# Patient Record
Sex: Male | Born: 1995 | Hispanic: No | Marital: Single | Smoking: Never smoker
Health system: Southern US, Community
[De-identification: ages and names within clinical notes are randomized; demographics above are authoritative.]

## PROBLEM LIST (undated history)

## (undated) DIAGNOSIS — A770 Spotted fever due to Rickettsia rickettsii: Secondary | ICD-10-CM

---
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
## 2014-05-29 IMAGING — CR DG CHEST 2V
2 series · 2 of 2 positions shown · non-contrast
Comparison: None.

CLINICAL DATA: Fever, cough, headache, and nausea and vomiting.

CHEST - 2 VIEW

[w chest pa]
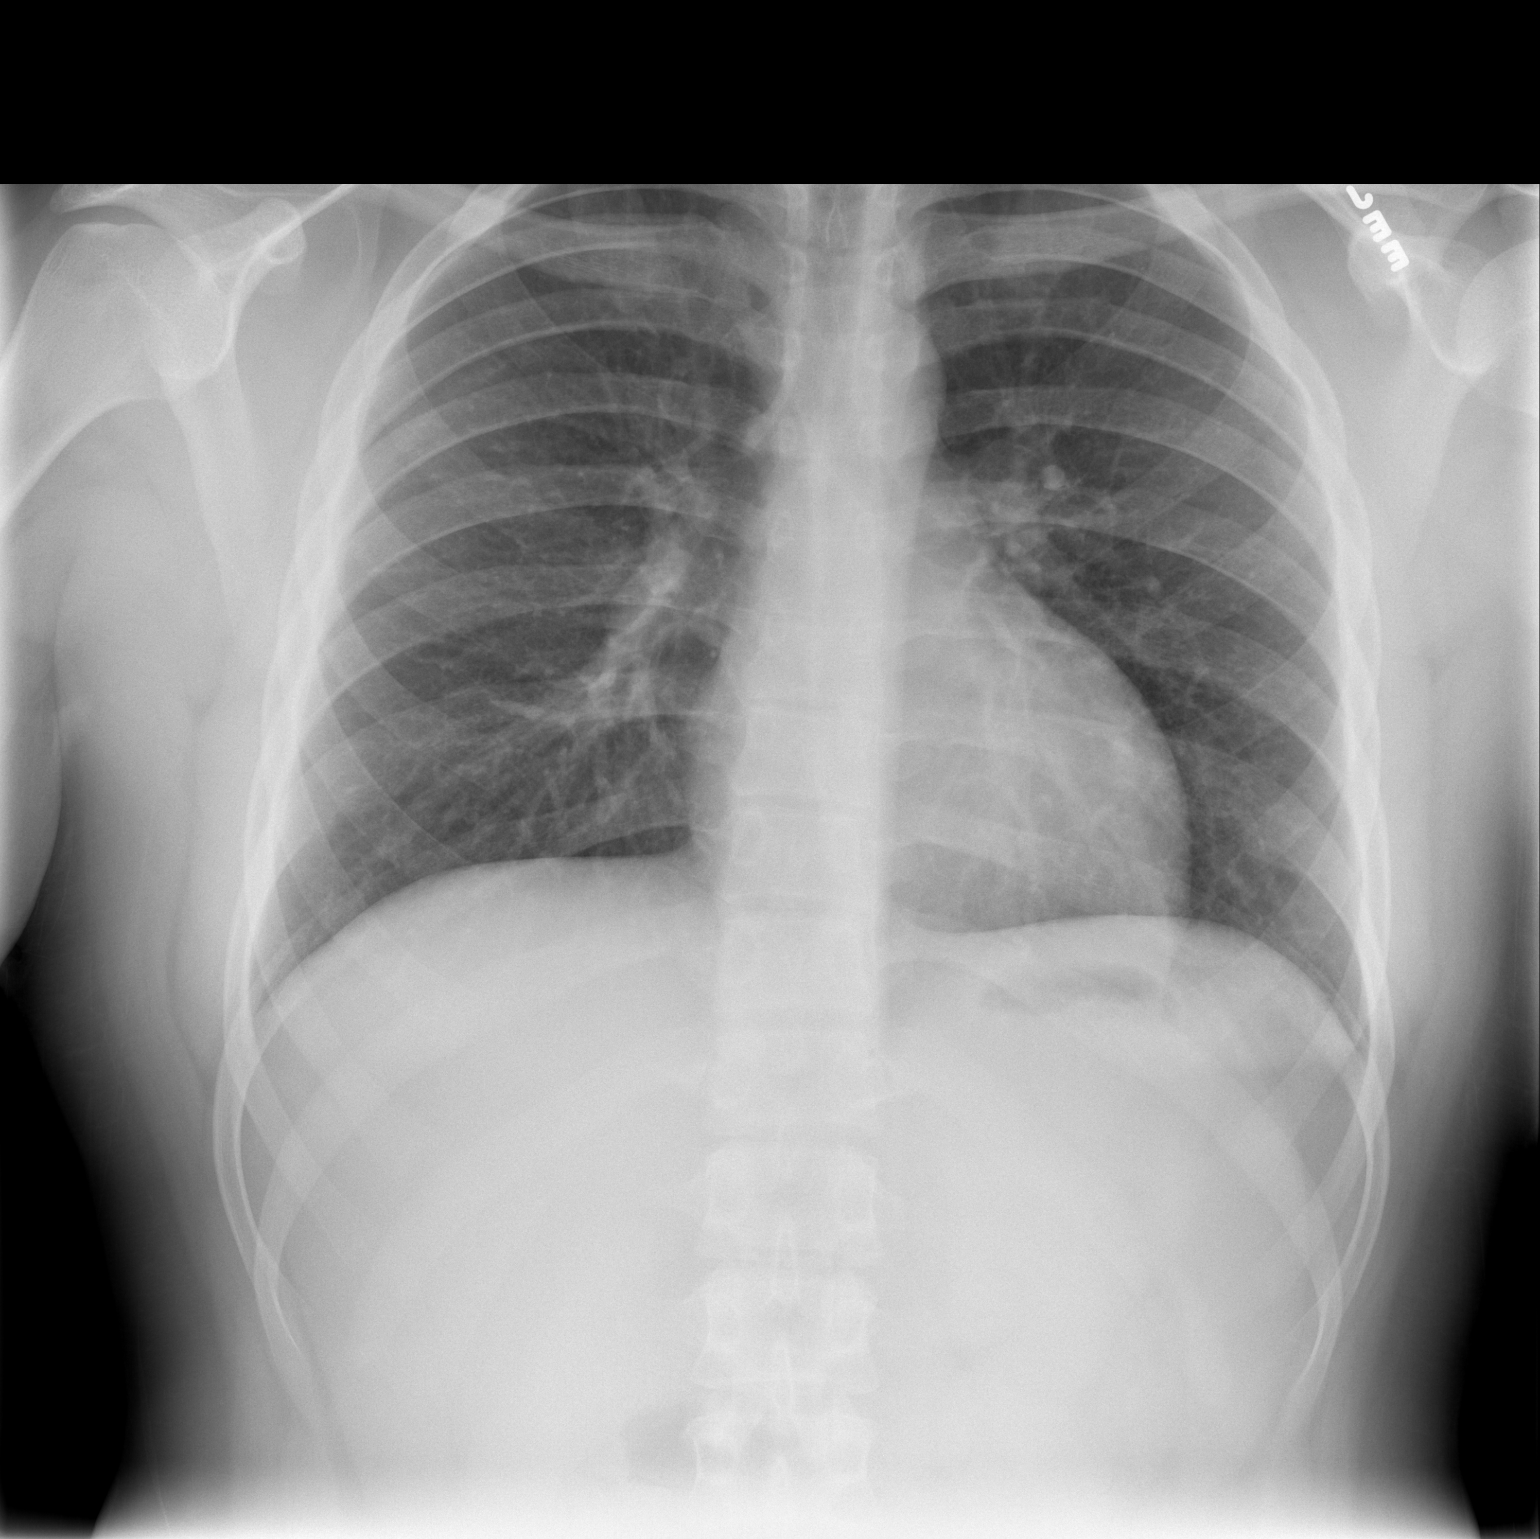

[w chest lat *]
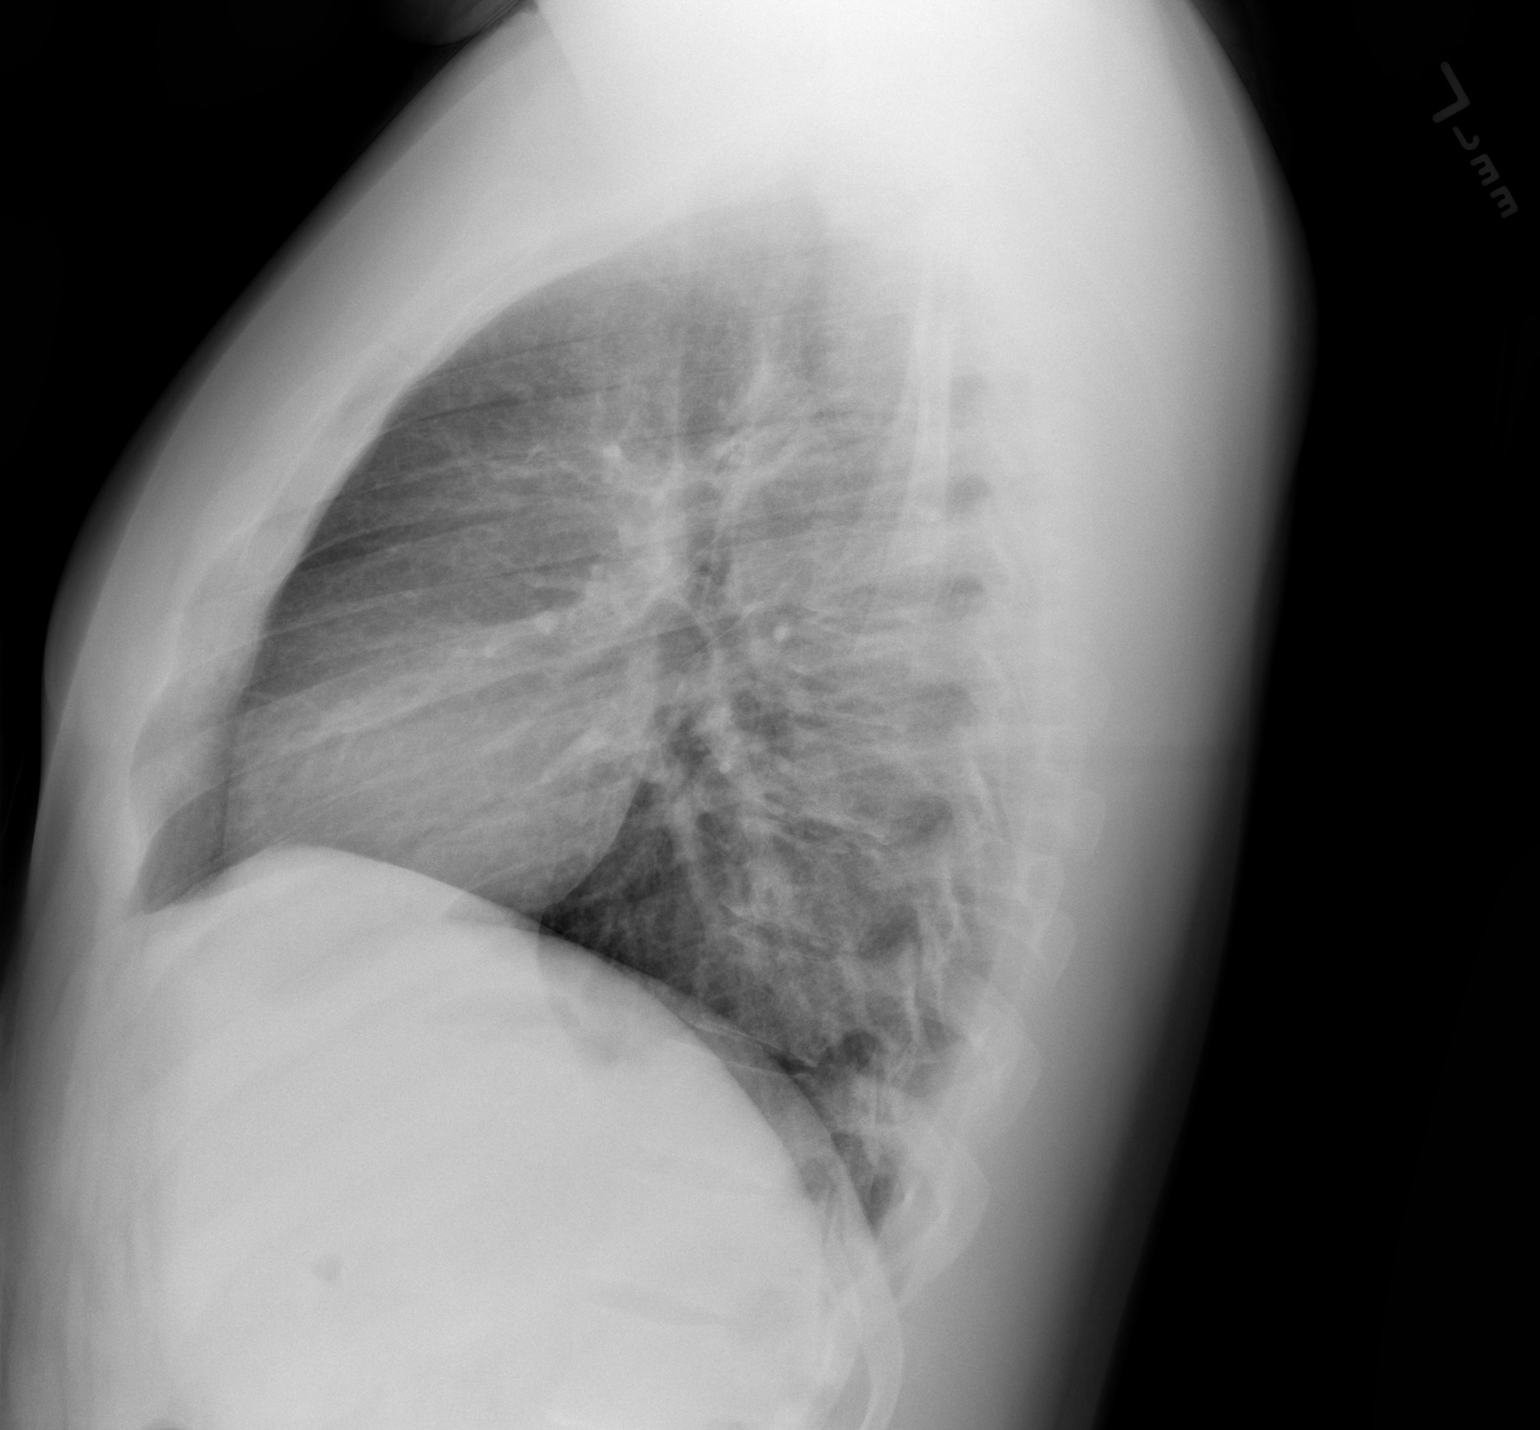

[2 of 2 positions shown; findings below may reference images not displayed]

FINDINGS: Heart size and pulmonary vascularity are normal and the
lungs are clear.  No osseous abnormality.
IMPRESSION: Normal exam.

## 2019-09-06 ENCOUNTER — Ambulatory Visit
Admission: EM | Admit: 2019-09-06 | Discharge: 2019-09-06 | Disposition: A | Discharge status: Home / Self Care | Payer: Self-pay

## 2019-09-06 DIAGNOSIS — L859 Epidermal thickening, unspecified: Secondary | ICD-10-CM

## 2019-09-06 NOTE — Discharge Instructions (Addendum)
Keep area(s) clean and dry. Return for worsening pain, redness, swelling, discharge, fever.  Helpful prevention tips: Keep nails short to avoid secondary skin infections. Use new, clean razors when shaving. Avoid antiperspirants - look for deodorants without aluminum. Avoid wearing underwire bras as this can irritate the area further.

## 2019-09-06 NOTE — ED Provider Notes (Signed)
[facility]    CSN: [id number] Arrival date & time: 09/06/19  1706      History   Chief Complaint Chief Complaint  Patient presents with  . Wound Check    HPI George Charles is a 24 y.o. male presenting for left lower extremity wound check.  States that he slipped on a ladder, hit his left shin around late November.  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  Denies significant pain, blackening, distal extremity weakness or numbness, fever, arthralgias, myalgias.  Denies history of diabetes, immunocompromised state.   Past Medical History:  Diagnosis Date  . [facility] spotted fever     There are no problems to display for this patient.   History reviewed. No pertinent surgical history.     Home Medications    Prior to Admission medications   Not on File    Family History History reviewed. No pertinent family history.  Social History Social History   Tobacco Use  . Smoking status: Never Smoker  . Smokeless tobacco: Never Used  Substance Use Topics  . Alcohol use: No  . Drug use: No     Allergies   Patient has no known allergies.   Review of Systems As per HPI   Physical Exam Triage Vital Signs ED Triage Vitals  Enc Vitals Group     BP 09/06/19 1751 (!) 152/93     Pulse Rate 09/06/19 1751 60     Resp 09/06/19 1751 14     Temp 09/06/19 1751 98 F (36.7 C)     Temp Source 09/06/19 1751 Oral     SpO2 09/06/19 1751 95 %     Weight --      Height --      Head Circumference --      Peak Flow --      Pain Score 09/06/19 1759 0     Pain Loc --      Pain Edu? --      Excl. in [place]? --    No data found.  Updated Vital Signs BP (!) 152/93 (BP Location: Left Arm)   Pulse 60   Temp 98 F (36.7 C) (Oral)   Resp 14   SpO2 95%   Visual Acuity Right Eye Distance:   Left Eye Distance:   Bilateral  Distance:    Right Eye Near:   Left Eye Near:    Bilateral Near:     Physical Exam Constitutional:      General: He is not in acute distress. HENT:     Head: Normocephalic and atraumatic.  Eyes:     General: No scleral icterus.    Pupils: Pupils are equal, round, and reactive to light.  Cardiovascular:     Rate and Rhythm: Normal rate.  Pulmonary:     Effort: Pulmonary effort is normal. No respiratory distress.     Breath sounds: No wheezing.  Musculoskeletal:        General: Normal range of motion.     Right lower leg: No edema.     Left lower leg: No edema.  Skin:    Coloration: Skin is not jaundiced or pale.     Comments: 1 cm circumferential scab with hyperkeratosis.  Minimal surrounding hyperpigmentation that is without warmth or tenderness.  No streaking or surrounding fluctuance/induration.  Neurological:  Mental Status: He is alert and oriented to person, place, and time.      UC Treatments / Results  Labs (all labs ordered are listed, but only abnormal results are displayed) Labs Reviewed - No data to display  EKG   Radiology No results found.  Procedure  PROCEDURE NOTE:  Procedure: Skin lesion removal Location: left lower leg, anterior Size: 1 cm Risk/benefits of procedure were discussed and all questions answered.  Consent obtained verbally by patient prior to procedure. Anesthesia administered locally with 2 cc of 2% lidocaine. Anesthesia was achieved.    Procedure details:  Area prepped and draped in usual sterile fashion.  A 1 cm round skin lesion was grasped with kelly hemostats and excised using a # 15 blade.  Good granulation tissue without bleeding or purulent discharge was present.  Patient tolerated procedure well.  Estimated blood loss 0 cc.  Medications Ordered in UC Medications - No data to display  Initial Impression / Assessment and Plan / UC Course  I have reviewed the triage vital signs and the nursing notes.  Pertinent labs  & imaging results that were available during my care of the patient were reviewed by me and considered in my medical decision making (see chart for details).     H&P consistent with hyperkeratosis of skin over previous injury.  Likely due to repeated microtrauma's since.  Provided PCP contact permission to establish care for routine health screenings and maintenance.  Performed skin lesion excision which patient tolerated well.  Good granulation tissue underneath without sure, purulence.  Reviewed proper wound care.  Return precautions discussed, patient verbalized understanding and is agreeable to plan. Final Clinical Impressions(s) / UC Diagnoses   Final diagnoses:  Hyperkeratosis of skin     Discharge Instructions     Keep area(s) clean and dry. Return for worsening pain, redness, swelling, discharge, fever.  Helpful prevention tips: Keep nails short to avoid secondary skin infections. Use new, clean razors when shaving. Avoid antiperspirants - look for deodorants without aluminum. Avoid wearing underwire bras as this can irritate the area further.     ED Prescriptions    None     PDMP not reviewed this encounter.   Hall[place], [place], [place] 09/07/19 [phone]

## 2019-09-06 NOTE — ED Triage Notes (Signed)
Pt states fell off a ladder 05/2019 and has a wound to LLE that hasn't healed all the way.
